# Patient Record
Sex: Female | Born: 1960 | Race: White | Hispanic: No | Marital: Married | Smoking: Never smoker
Health system: Southern US, Community
[De-identification: ages and names within clinical notes are randomized; demographics above are authoritative.]

## PROBLEM LIST (undated history)

## (undated) DIAGNOSIS — E559 Vitamin D deficiency, unspecified: Secondary | ICD-10-CM

## (undated) DIAGNOSIS — I10 Essential (primary) hypertension: Secondary | ICD-10-CM

## (undated) DIAGNOSIS — M797 Fibromyalgia: Secondary | ICD-10-CM

## (undated) DIAGNOSIS — G8929 Other chronic pain: Secondary | ICD-10-CM

## (undated) HISTORY — PX: DILATION AND CURETTAGE OF UTERUS: SHX78

## (undated) HISTORY — PX: GASTRIC BYPASS: SHX52

## (undated) HISTORY — PX: SHOULDER SURGERY: SHX246

## (undated) HISTORY — PX: ENDOMETRIAL ABLATION: SHX621

---

## 2015-04-02 ENCOUNTER — Emergency Department (HOSPITAL_COMMUNITY): Payer: BC Managed Care – PPO

## 2015-04-02 ENCOUNTER — Encounter (HOSPITAL_COMMUNITY): Payer: Self-pay | Admitting: Emergency Medicine

## 2015-04-02 ENCOUNTER — Emergency Department (HOSPITAL_COMMUNITY)
Admission: EM | Admit: 2015-04-02 | Discharge: 2015-04-02 | Disposition: A | Discharge status: Home / Self Care | Payer: BC Managed Care – PPO | Attending: Emergency Medicine | Admitting: Emergency Medicine

## 2015-04-02 DIAGNOSIS — W010XXA Fall on same level from slipping, tripping and stumbling without subsequent striking against object, initial encounter: Secondary | ICD-10-CM | POA: Insufficient documentation

## 2015-04-02 DIAGNOSIS — S92301A Fracture of unspecified metatarsal bone(s), right foot, initial encounter for closed fracture: Secondary | ICD-10-CM

## 2015-04-02 DIAGNOSIS — G8929 Other chronic pain: Secondary | ICD-10-CM | POA: Diagnosis not present

## 2015-04-02 DIAGNOSIS — Y9289 Other specified places as the place of occurrence of the external cause: Secondary | ICD-10-CM | POA: Insufficient documentation

## 2015-04-02 DIAGNOSIS — S92354A Nondisplaced fracture of fifth metatarsal bone, right foot, initial encounter for closed fracture: Secondary | ICD-10-CM | POA: Insufficient documentation

## 2015-04-02 DIAGNOSIS — Y998 Other external cause status: Secondary | ICD-10-CM | POA: Diagnosis not present

## 2015-04-02 DIAGNOSIS — Z8739 Personal history of other diseases of the musculoskeletal system and connective tissue: Secondary | ICD-10-CM | POA: Insufficient documentation

## 2015-04-02 DIAGNOSIS — Y9389 Activity, other specified: Secondary | ICD-10-CM | POA: Diagnosis not present

## 2015-04-02 DIAGNOSIS — I10 Essential (primary) hypertension: Secondary | ICD-10-CM | POA: Insufficient documentation

## 2015-04-02 DIAGNOSIS — Z8639 Personal history of other endocrine, nutritional and metabolic disease: Secondary | ICD-10-CM | POA: Insufficient documentation

## 2015-04-02 DIAGNOSIS — S99921A Unspecified injury of right foot, initial encounter: Secondary | ICD-10-CM | POA: Diagnosis present

## 2015-04-02 HISTORY — DX: Fibromyalgia: M79.7

## 2015-04-02 HISTORY — DX: Other chronic pain: G89.29

## 2015-04-02 HISTORY — DX: Vitamin D deficiency, unspecified: E55.9

## 2015-04-02 HISTORY — DX: Essential (primary) hypertension: I10

## 2015-04-02 MED ORDER — OXYCODONE-ACETAMINOPHEN 5-325 MG PO TABS: 1.0000 | ORAL_TABLET | Freq: Four times a day (QID) | ORAL | Status: AC | PRN

## 2015-04-02 NOTE — Discharge Instructions (Signed)
Metatarsal Fracture, Undisplaced  A metatarsal fracture is a break in the bone(s) of the foot. These are the bones of the foot that connect your toes to the bones of the ankle.  DIAGNOSIS   The diagnoses of these fractures are usually made with X-rays. If there are problems in the forefoot and x-rays are normal a later bone scan will usually make the diagnosis.   TREATMENT AND HOME CARE INSTRUCTIONS  · Treatment may or may not include a cast or walking shoe. When casts are needed the use is usually for short periods of time so as not to slow down healing with muscle wasting (atrophy).  · Activities should be stopped until further advised by your caregiver.  · Wear shoes with adequate shock absorbing capabilities and stiff soles.  · Alternative exercise may be undertaken while waiting for healing. These may include bicycling and swimming, or as your caregiver suggests.  · It is important to keep all follow-up visits or specialty referrals. The failure to keep these appointments could result in improper bone healing and chronic pain or disability.  · Warning: Do not drive a car or operate a motor vehicle until your caregiver specifically tells you it is safe to do so.  IF YOU DO NOT HAVE A CAST OR SPLINT:  · You may walk on your injured foot as tolerated or advised.  · Do not put any weight on your injured foot for as long as directed by your caregiver. Slowly increase the amount of time you walk on the foot as the pain allows or as advised.  · Use crutches until you can bear weight without pain. A gradual increase in weight bearing may help.  · Apply ice to the injury for 15-20 minutes each hour while awake for the first 2 days. Put the ice in a plastic bag and place a towel between the bag of ice and your skin.  · Only take over-the-counter or prescription medicines for pain, discomfort, or fever as directed by your caregiver.  SEEK IMMEDIATE MEDICAL CARE IF:   · Your cast gets damaged or breaks.  · You have  continued severe pain or more swelling than you did before the cast was put on, or the pain is not controlled with medications.  · Your skin or nails below the injury turn blue or grey, or feel cold or numb.  · There is a bad smell, or new stains or pus-like (purulent) drainage coming from the cast.  MAKE SURE YOU:   · Understand these instructions.  · Will watch your condition.  · Will get help right away if you are not doing well or get worse.  Document Released: 06/09/2002 Document Revised: 12/10/2011 Document Reviewed: 04/30/2008  [employer]® Patient Information ©2015 [employer]. This information is not intended to replace advice given to you by your health care provider. Make sure you discuss any questions you have with your health care provider.

## 2015-04-02 NOTE — ED Provider Notes (Signed)
History  This chart was scribed for non-physician practitioner, Teressa LowerVrinda Donnivan Villena, PA-C,working with Tilden FossaElizabeth Rees, MD, by Karle PlumberJennifer Tensley, ED Scribe. This patient was seen in room WTR7/WTR7 and the patient's care was started at 7:09 PM.  Chief Complaint  Patient presents with  . Leg Swelling   The history is provided by the patient and medical records. No language interpreter was used.    HPI Comments:  Deborah Hamilton is a 54 y.o. female who presents to the Emergency Department complaining of lateral right foot pain that began secondary to twisting her foot PTA. She reports that she slipped on a rug and fell. She has taken one of her home pain medications that she normally takes for her shoulder pain. Walking and bearing weight makes the pain worse. She denies alleviating factors. Denies head trauma, LOC, nausea, vomiting, numbness, tingling or weakness of the right foot, right ankle, knee or hip pain, bruising or wounds.   Past Medical History  Diagnosis Date  . Vitamin D deficiency   . Fibromyalgia   . Chronic pain   . Hypertension    Past Surgical History  Procedure Laterality Date  . Shoulder surgery    . Cesarean section    . Dilation and curettage of uterus    . Endometrial ablation    . Gastric bypass     Family History  Problem Relation Age of Onset  . Diabetes Mother   . Hypertension Mother   . Hyperlipidemia Mother   . Heart Problems Mother   . Depression Mother    History  Substance Use Topics  . Smoking status: Never Smoker   . Smokeless tobacco: Not on file  . Alcohol Use: No   OB History    No data available     Review of Systems  Gastrointestinal: Negative for nausea and vomiting.  Musculoskeletal: Positive for arthralgias. Negative for joint swelling.  Skin: Negative for color change and wound.  Neurological: Negative for syncope, weakness and numbness.    Allergies  Review of patient's allergies indicates not on file.  Home Medications    Prior to Admission medications   Not on File   Triage Vitals: BP 103/57 mmHg  Pulse 90  Resp 18  SpO2 100%  LMP  Physical Exam  Constitutional: She is oriented to person, place, and time. She appears well-developed and well-nourished.  HENT:  Head: Normocephalic and atraumatic.  Eyes: EOM are normal.  Neck: Normal range of motion.  Cardiovascular: Normal rate.   Pulmonary/Chest: Effort normal.  Musculoskeletal: Normal range of motion.  Swelling of the right foot and ankle. No gross deformity. Pulses intact  Neurological: She is alert and oriented to person, place, and time.  Skin: Skin is warm and dry.  Psychiatric: She has a normal mood and affect. Her behavior is normal.  Nursing note and vitals reviewed.   ED Course  Procedures (including critical care time) DIAGNOSTIC STUDIES: Oxygen Saturation is 100% on RA, normal by my interpretation.   COORDINATION OF CARE: 7:13 PM- Will X-Ray right foot and ankle. Pt verbalizes understanding and agrees to plan.  Medications - No data to display  Labs Review Labs Reviewed - No data to display  Imaging Review Dg Ankle Complete Right  04/02/2015   CLINICAL DATA:  Initial encounter for trauma.  Pain.  EXAM: RIGHT ANKLE - COMPLETE 3+ VIEW  COMPARISON:  None.  FINDINGS: Lateral soft tissue swelling. Small Achilles and calcaneal spurs. Transverse fracture at the base of the fifth metatarsal. Talar  dome intact.  IMPRESSION: Transverse fracture at the base of the fifth metatarsal. Mild lateral soft tissue swelling.   Electronically Signed   By: Jeronimo Greaves M.D.   On: 04/02/2015 19:48   Dg Foot Complete Right  04/02/2015   CLINICAL DATA:  Lateral pain that began after twisting the foot by slipping on a rug and falling. Recent injury.  EXAM: RIGHT FOOT COMPLETE - 3+ VIEW  COMPARISON:  Ankle study same day  FINDINGS: Nondisplaced avulsion type fracture at the base of the fifth metatarsal. No other foot fracture. No degenerative changes.  Small calcaneal spurs incidentally noted.  IMPRESSION: Nondisplaced avulsion type fracture at the base of the fifth metatarsal.   Electronically Signed   By: Paulina Fusi M.D.   On: 04/02/2015 19:48     EKG Interpretation None      MDM   Final diagnoses:  Metatarsal fracture, right, closed, initial encounter    Pt placed in a Deborah walker crutches. Given oxycodone for pain. Pt to follow up with ortho at home  I personally performed the services described in this documentation, which was scribed in my presence. The recorded information has been reviewed and is accurate.    Teressa Lower, NP 04/02/15 1959  Tilden Fossa, MD 04/02/15 [phone]

## 2015-04-02 NOTE — ED Notes (Signed)
Ortho tech at bedside 

## 2015-04-02 NOTE — ED Notes (Signed)
Pt reported rt foot swelling s/p to tripping over rug and falling. Denies hitting head. (-)LOC, (+)PMS, CRT brisk, LROM, noted swelling to lateral rt foot. No deformity/bruising noted.

## 2015-04-03 ENCOUNTER — Telehealth (HOSPITAL_COMMUNITY): Payer: Self-pay

## 2015-04-03 NOTE — Telephone Encounter (Signed)
Pharmacy in [place] calling to verify Rx for Percocet.

## 2017-01-15 IMAGING — CR DG FOOT COMPLETE 3+V*R*
3 series · 3 of 3 positions shown · non-contrast
Comparison: Ankle study same day

CLINICAL DATA: Lateral pain that began after twisting the foot by
slipping on a rug and falling. Recent injury.

EXAM:
RIGHT FOOT COMPLETE - 3+ VIEW

[x foot ap right]
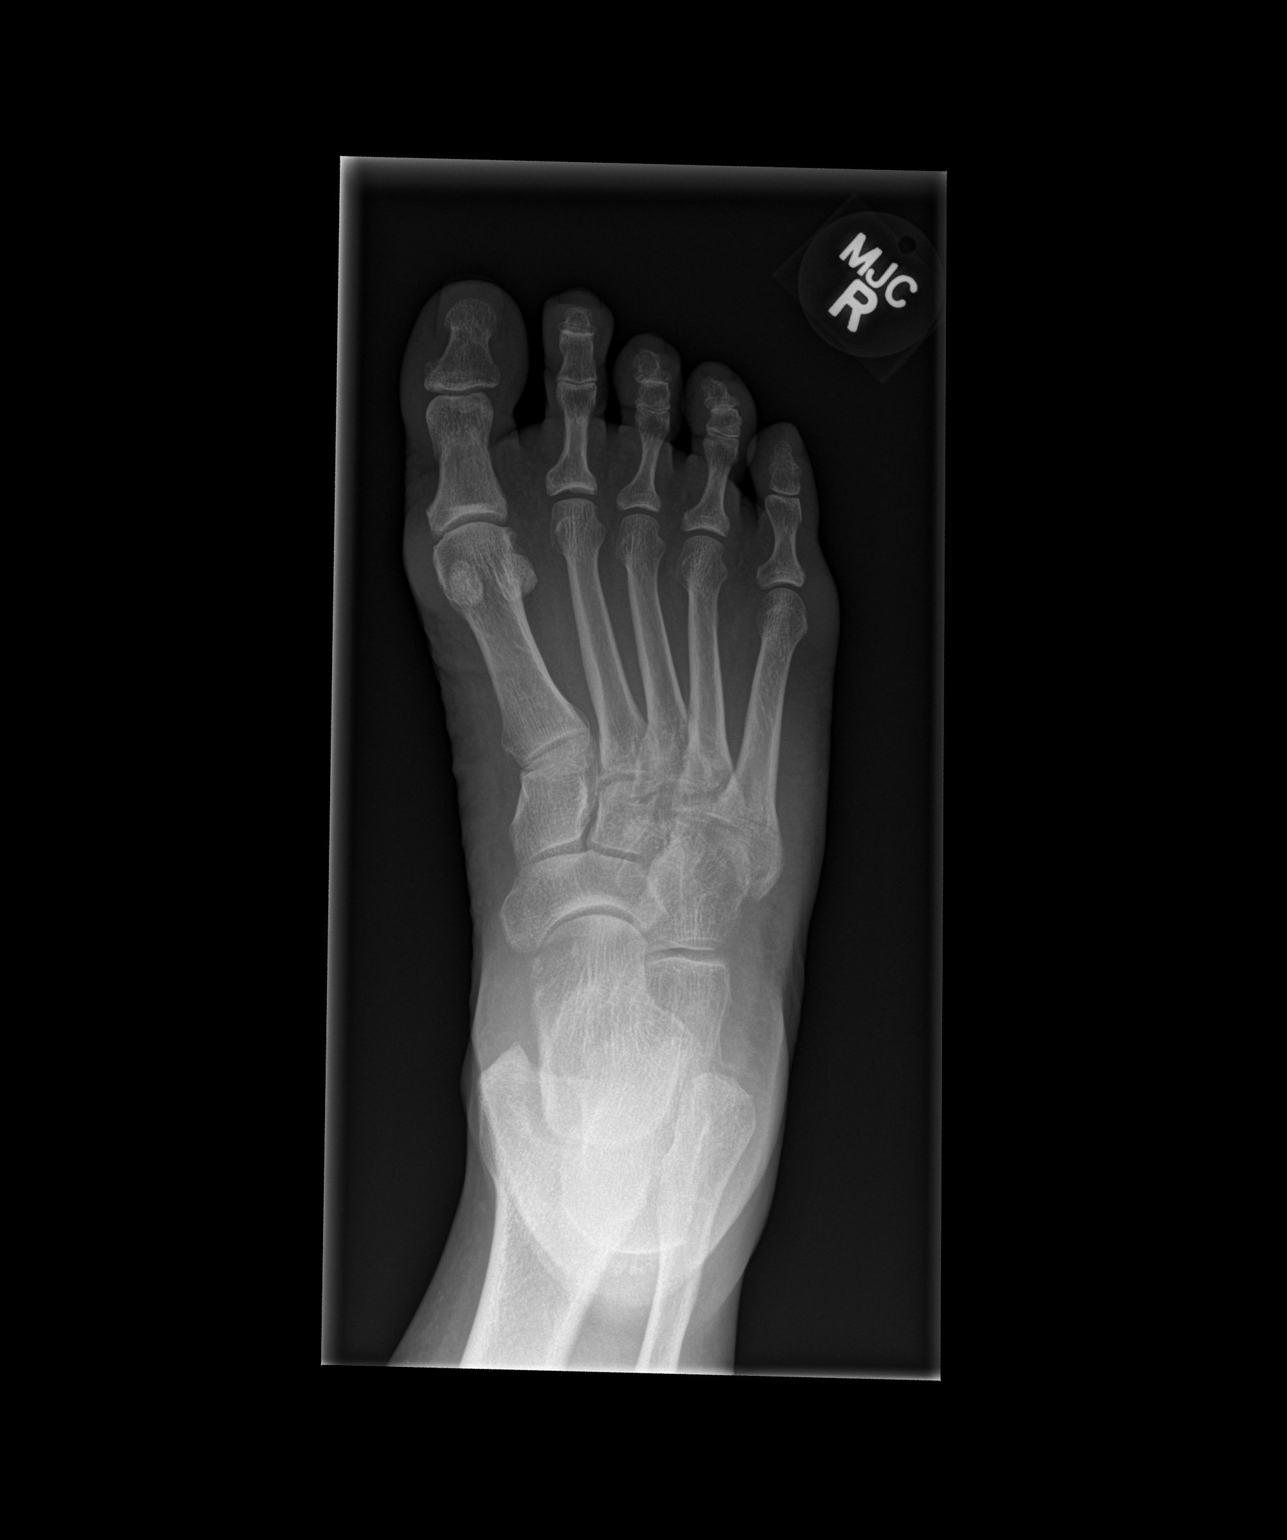

[x foot obl right]
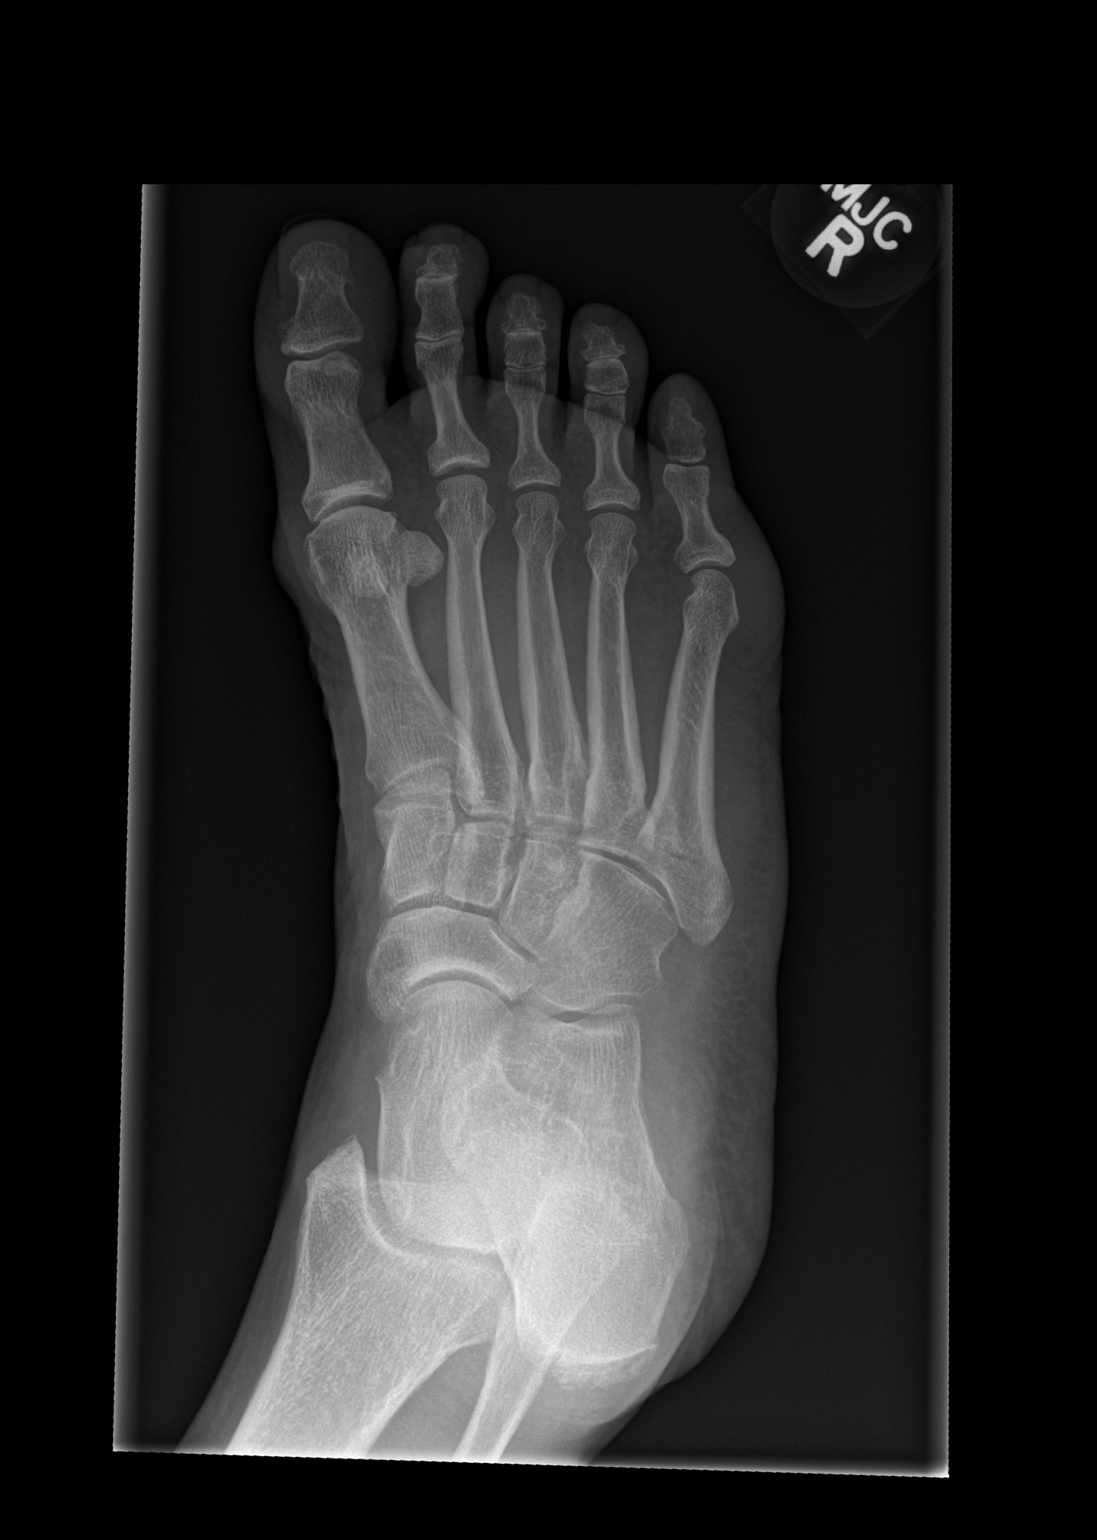

[x foot lat right]
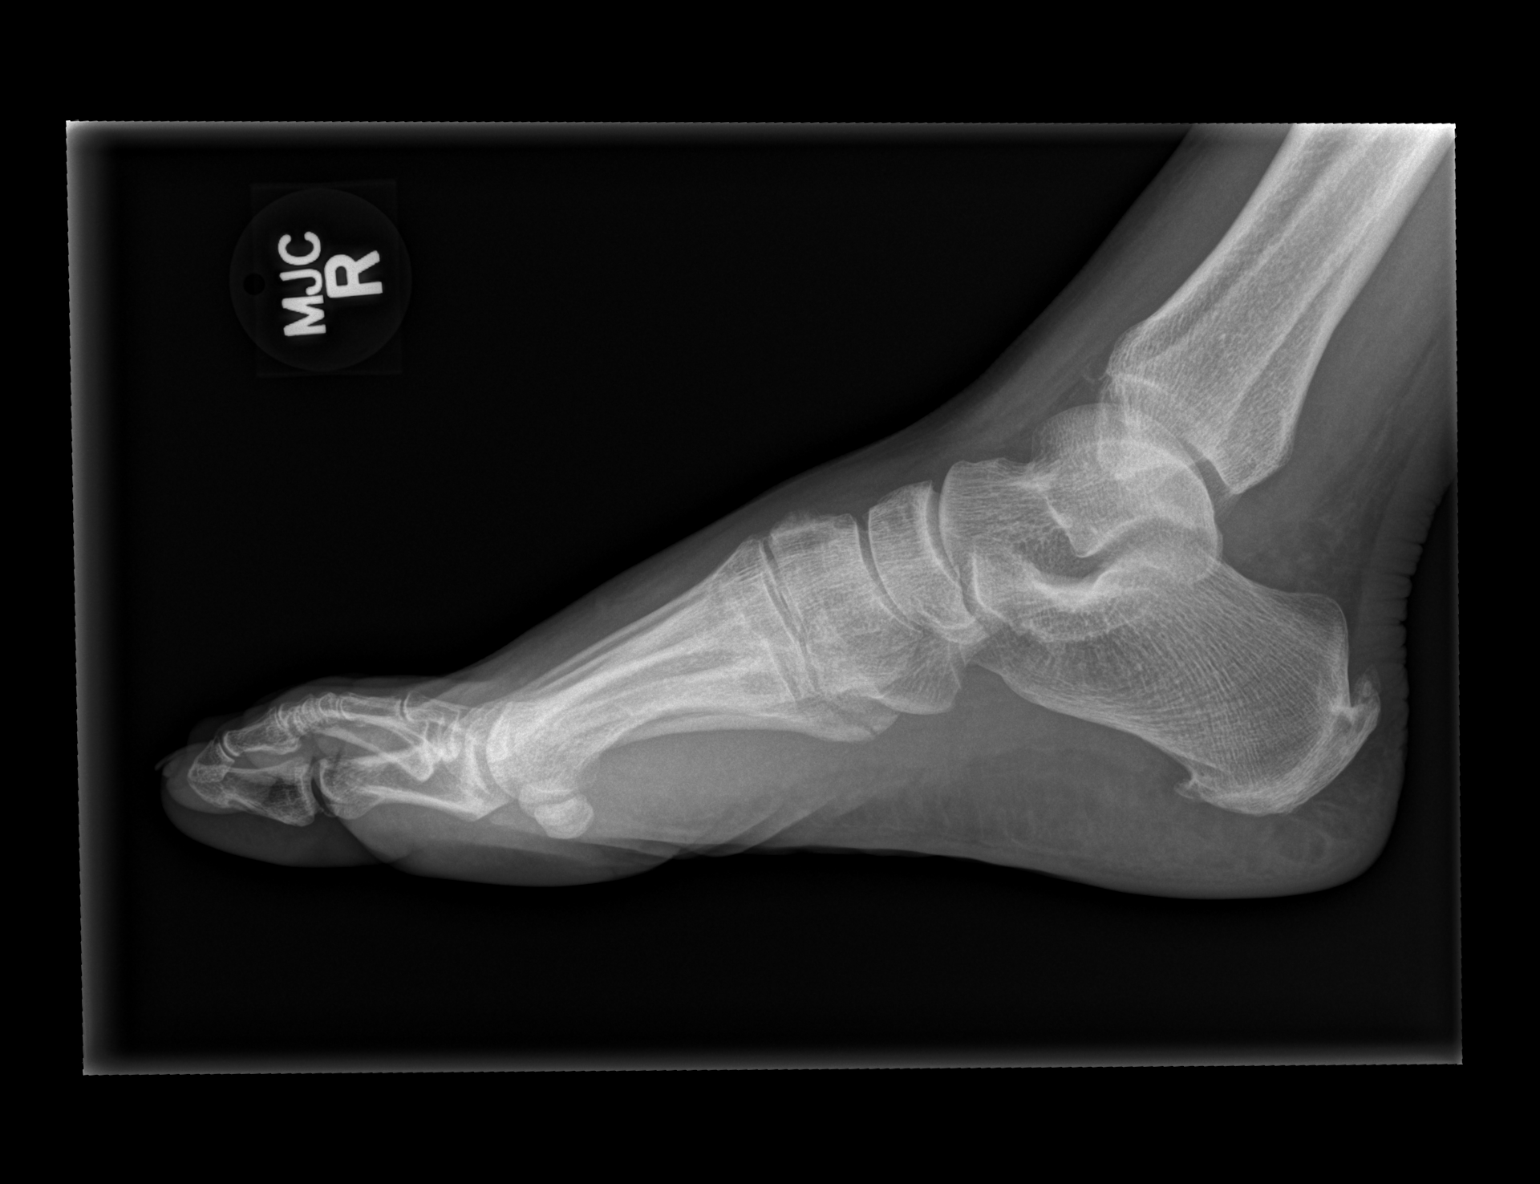

[3 of 3 positions shown; findings below may reference images not displayed]

FINDINGS: Nondisplaced avulsion type fracture at the base of the fifth
metatarsal. No other foot fracture. No degenerative changes. Small
calcaneal spurs incidentally noted.
IMPRESSION: Nondisplaced avulsion type fracture at the base of the fifth
metatarsal.
